# Patient Record
Sex: Male | Born: 2005 | Race: Black or African American | Hispanic: No | Marital: Single | Smoking: Never smoker
Health system: Southern US, Community
[De-identification: ages and names within clinical notes are randomized; demographics above are authoritative.]

## PROBLEM LIST (undated history)

## (undated) HISTORY — PX: CIRCUMCISION: SUR203

## (undated) HISTORY — PX: ADENOIDECTOMY: SUR15

---
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
## 2024-05-12 ENCOUNTER — Ambulatory Visit
Admission: RE | Admit: 2024-05-12 | Discharge: 2024-05-12 | Disposition: A | Discharge status: Home / Self Care | Source: Ambulatory Visit | Attending: Family Medicine | Admitting: Family Medicine

## 2024-05-12 VITALS — BP 125/80 | HR 89 | Temp 99.2°F | Resp 19 | Ht 64.0 in | Wt 100.0 lb

## 2024-05-12 DIAGNOSIS — Z9189 Other specified personal risk factors, not elsewhere classified: Secondary | ICD-10-CM | POA: Diagnosis not present

## 2024-05-12 DIAGNOSIS — N481 Balanitis: Secondary | ICD-10-CM

## 2024-05-12 MED ORDER — CLOTRIMAZOLE-BETAMETHASONE 1-0.05 % EX CREA: TOPICAL_CREAM | CUTANEOUS | 0 refills | Status: AC

## 2024-05-12 NOTE — ED Triage Notes (Signed)
 Pt states that he has 2 bumps on his penis.  Pt states that he also has a loss of appetite and body aches.   Pt wants to tested for std.

## 2024-05-12 NOTE — Discharge Instructions (Addendum)
 Your swab has been sent to the laboratory.  You will be called if any results are positive.  You can check your results on MyChart  The rash you showed me looks like a form of jock itch/fungal rash.  I am giving you prescription to use.  Use a small amount 2 times a day until rash clears.  Do not use longer than 2 weeks

## 2024-05-12 NOTE — ED Provider Notes (Signed)
 [facility]    CSN: [id number] Arrival date & time: 05/12/24  1453      History   Chief Complaint Chief Complaint  Patient presents with   Exposure to STD    son c/o discomfort and pain with irritation also may have been exposed to an STD - Entered by patient    HPI Travis Weiss is a 18 y.o. male.   Patient has a couple of spots on his penis that he is worried about.  He worries he might have an STD.  He does endorse unprotected sex.  Has no drainage no dysuria no fever.  History of headaches    History reviewed. No pertinent past medical history.  Patient Active Problem List   Diagnosis Date Noted   Migraine without aura and without status migrainosus, not intractable 05/11/2015   Episodic tension-type headache, not intractable 05/11/2015    Past Surgical History:  Procedure Laterality Date   ADENOIDECTOMY     CIRCUMCISION         Home Medications    Prior to Admission medications   Medication Sig Start Date End Date Taking? Authorizing Provider  clotrimazole -betamethasone  (LOTRISONE ) cream Apply to affected area 2 times daily prn 05/12/24  Yes Maranda Jamee Jacob, MD  beclomethasone (QVAR) 80 MCG/ACT inhaler Inhale 2 puffs into the lungs 2 (two) times daily.  01/31/20  [provider]  cetirizine (ZYRTEC) 10 MG tablet Take 10 mg by mouth daily.  01/31/20  [provider]  fluticasone (FLONASE) 50 MCG/ACT nasal spray Place 1 spray into both nostrils daily.  01/31/20  [provider]  montelukast (SINGULAIR) 5 MG chewable tablet Chew 5 mg by mouth at bedtime.  01/31/20  [provider]  Olopatadine HCl (PATANASE) 0.6 % SOLN Place 1 puff into the nose 2 (two) times daily.  01/31/20  [provider]    Family History Family History  Problem Relation Age of Onset   Hypertension Mother    Heart disease Mother    Healthy Father     Social History Social History   Tobacco Use   Smoking status: Never     Passive exposure: Yes   Smokeless tobacco: Never   Tobacco comments:    Step-fathe smokes outside  Vaping Use   Vaping status: Never Used  Substance Use Topics   Alcohol use: Never   Drug use: Yes    Types: Marijuana     Allergies   Amoxicillin, [place] grass extract, and Justicia adhatoda (malabar nut tree) [justicia adhatoda]   Review of Systems Review of Systems  See HPI Physical Exam Triage Vital Signs ED Triage Vitals  Encounter Vitals Group     BP 05/12/24 1528 125/80     Girls Systolic BP Percentile --      Girls Diastolic BP Percentile --      Boys Systolic BP Percentile --      Boys Diastolic BP Percentile --      Pulse Rate 05/12/24 1528 89     Resp 05/12/24 1528 19     Temp 05/12/24 1528 99.2 F (37.3 C)     Temp Source 05/12/24 1528 Oral     SpO2 05/12/24 1528 99 %     Weight 05/12/24 1525 100 lb (45.4 kg)     Height 05/12/24 1525 5' 4 (1.626 m)     Head Circumference --      Peak Flow --      Pain Score 05/12/24 1525 0  Pain Loc --      Pain Education --      Exclude from Growth Chart --    No data found.  Updated Vital Signs BP 125/80 (BP Location: Right Arm)   Pulse 89   Temp 99.2 F (37.3 C) (Oral)   Resp 19   Ht 5' 4 (1.626 m)   Wt 45.4 kg   SpO2 99%   BMI 17.16 kg/m       Physical Exam Constitutional:      General: He is not in acute distress.    Appearance: He is well-developed.  HENT:     Head: Normocephalic and atraumatic.  Eyes:     Conjunctiva/sclera: Conjunctivae normal.     Pupils: Pupils are equal, round, and reactive to light.  Cardiovascular:     Rate and Rhythm: Normal rate.  Pulmonary:     Effort: Pulmonary effort is normal. No respiratory distress.  Abdominal:     General: There is no distension.     Palpations: Abdomen is soft.  Genitourinary:    Comments: There are 2 irregular 4 and 5 cm patches of flaky skin.  Slightly erythematous. Musculoskeletal:        General: Normal range of motion.      Cervical back: Normal range of motion.  Skin:    General: Skin is warm and dry.  Neurological:     Mental Status: He is alert.      UC Treatments / Results  Labs (all labs ordered are listed, but only abnormal results are displayed) Labs Reviewed  CYTOLOGY, (ORAL, ANAL, URETHRAL) ANCILLARY ONLY    EKG   Radiology No results found.  Procedures Procedures (including critical care time)  Medications Ordered in UC Medications - No data to display  Initial Impression / Assessment and Plan / UC Course  I have reviewed the triage vital signs and the nursing notes.  Pertinent labs & imaging results that were available during my care of the patient were reviewed by me and considered in my medical decision making (see chart for details).     Final Clinical Impressions(s) / UC Diagnoses   Final diagnoses:  Balanitis  At risk for sexually transmitted disease due to unprotected sex     Discharge Instructions      Your swab has been sent to the laboratory.  You will be called if any results are positive.  You can check your results on MyChart  The rash you showed me looks like a form of jock itch/fungal rash.  I am giving you prescription to use.  Use a small amount 2 times a day until rash clears.  Do not use longer than 2 weeks    Addendum.  Patient requested blood work after he had been discharged.  By then it was after 4:00 in the evening and I told him we were unable to accommodate his request.  He can come back if he would like to have RPR and HIV testing.  I will authorize this without a medical visit ED Prescriptions     Medication Sig Dispense Auth. Provider   clotrimazole -betamethasone  (LOTRISONE ) cream Apply to affected area 2 times daily prn 15 g Maranda Jamee Jacob, MD      PDMP not reviewed this encounter.   Maranda Jamee Jacob, MD 05/12/24 [phone]

## 2024-05-13 ENCOUNTER — Telehealth: Payer: Self-pay

## 2024-05-13 ENCOUNTER — Telehealth: Payer: Self-pay | Admitting: Family Medicine

## 2024-05-13 DIAGNOSIS — Z202 Contact with and (suspected) exposure to infections with a predominantly sexual mode of transmission: Secondary | ICD-10-CM

## 2024-05-13 LAB — CYTOLOGY, (ORAL, ANAL, URETHRAL) ANCILLARY ONLY
Chlamydia: NEGATIVE
Comment: NEGATIVE
Comment: NEGATIVE
Comment: NORMAL
Neisseria Gonorrhea: NEGATIVE
Trichomonas: NEGATIVE

## 2024-05-13 NOTE — Telephone Encounter (Signed)
 Patient evaluated here on 05/12/2024 now returns for blood work.  Per CANDIE Moose, MD discharge instructions.

## 2024-05-14 LAB — SPECIMEN STATUS REPORT

## 2024-05-14 LAB — HEP, RPR, HIV PANEL
HIV Screen 4th Generation wRfx: NONREACTIVE
Hepatitis B Surface Ag: NEGATIVE
RPR Ser Ql: NONREACTIVE
